# Patient Record
Sex: Male | Born: 2000 | Race: Black or African American | Marital: Single | Smoking: Never smoker
Health system: Southern US, Community
[De-identification: ages and names within clinical notes are randomized; demographics above are authoritative.]

---
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
## 2021-05-30 IMAGING — CR DG KNEE COMPLETE 4+V*R*
1 series · 4 of 4 positions shown · non-contrast
Comparison: None.

CLINICAL DATA: Knee pain

EXAM:
RIGHT KNEE - COMPLETE 4+ VIEW

[Series 1: dg knee complete 4 views right · 0.14mm/px · 4 of 4 slices shown]
[im 1/4]
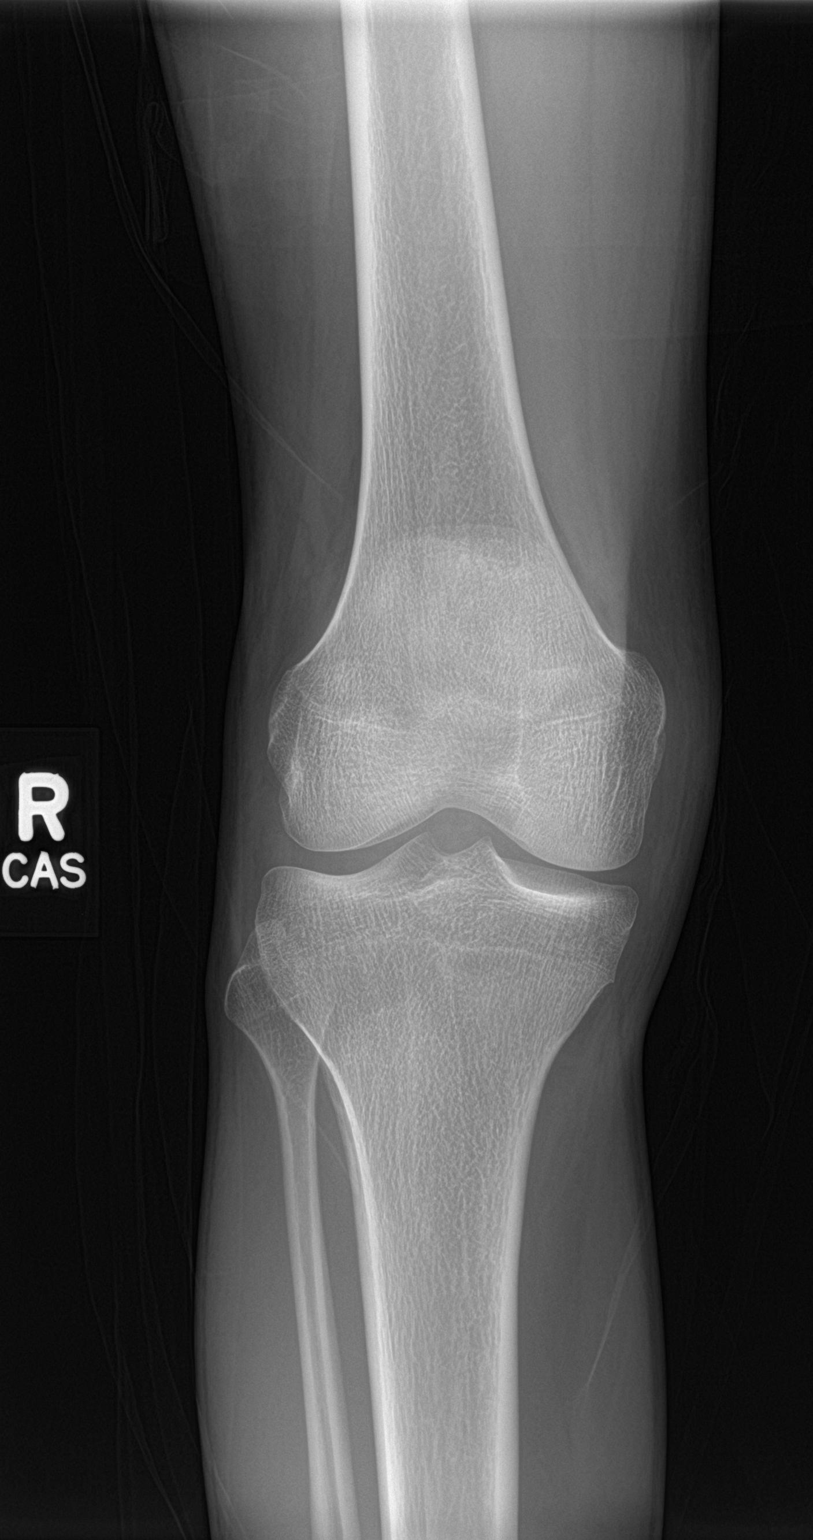
[im 2/4]
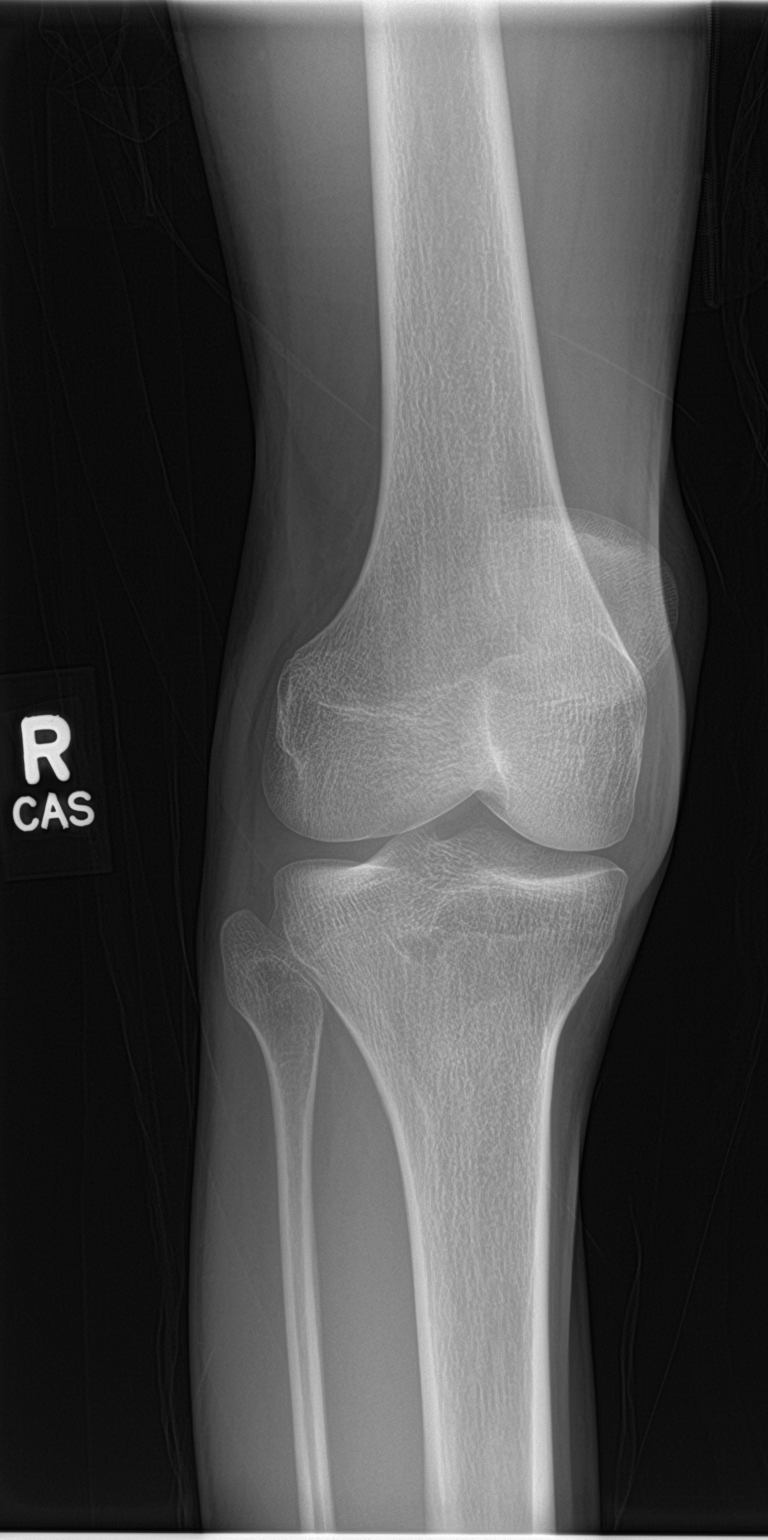
[im 3/4]
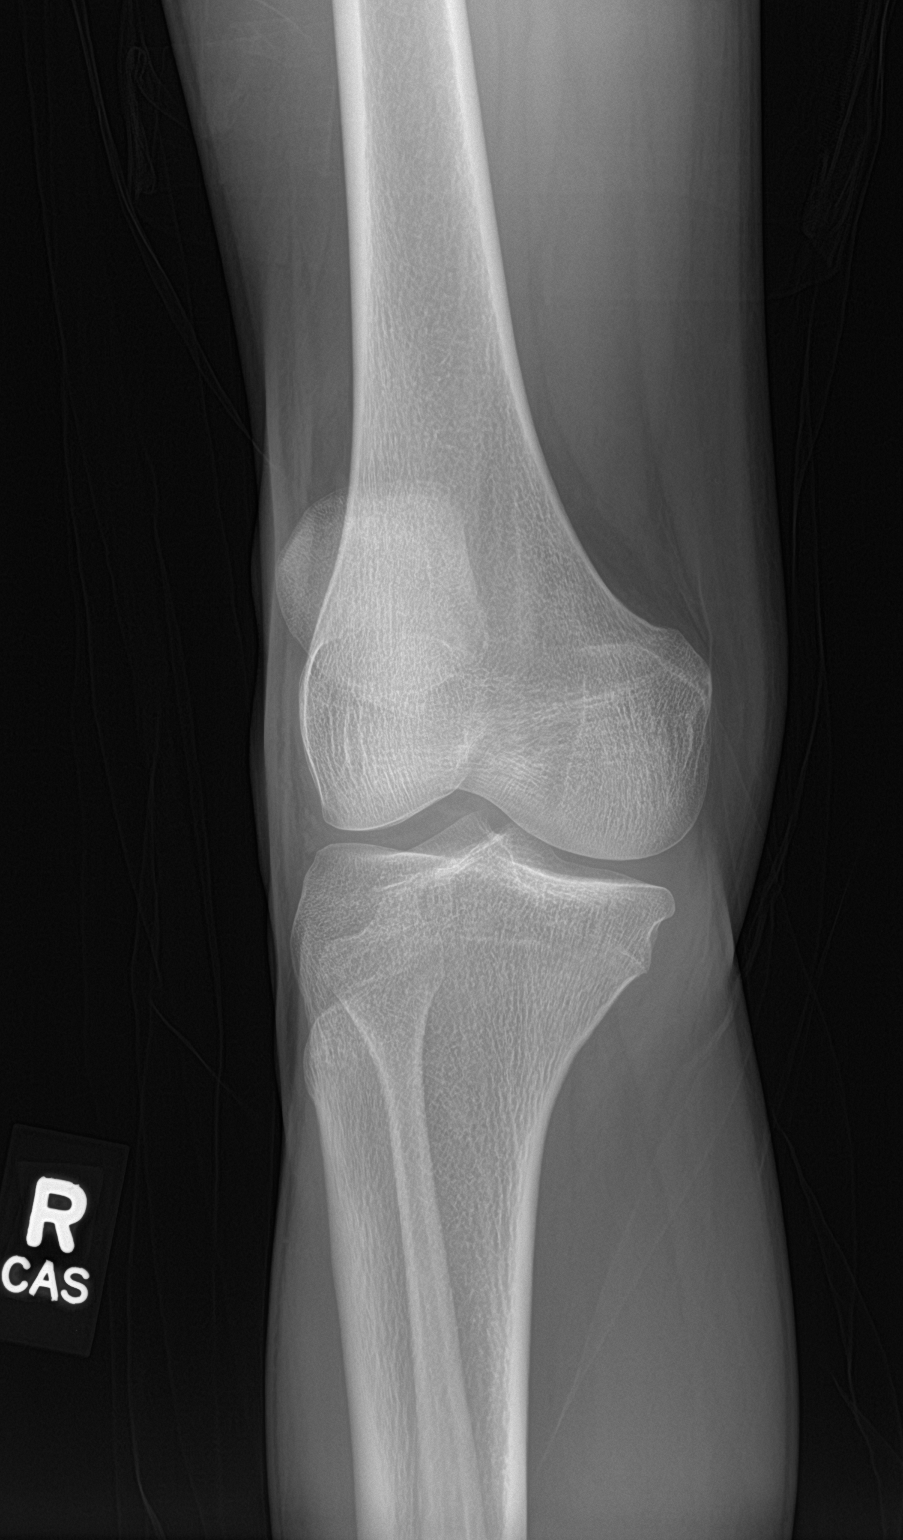
[im 4/4]
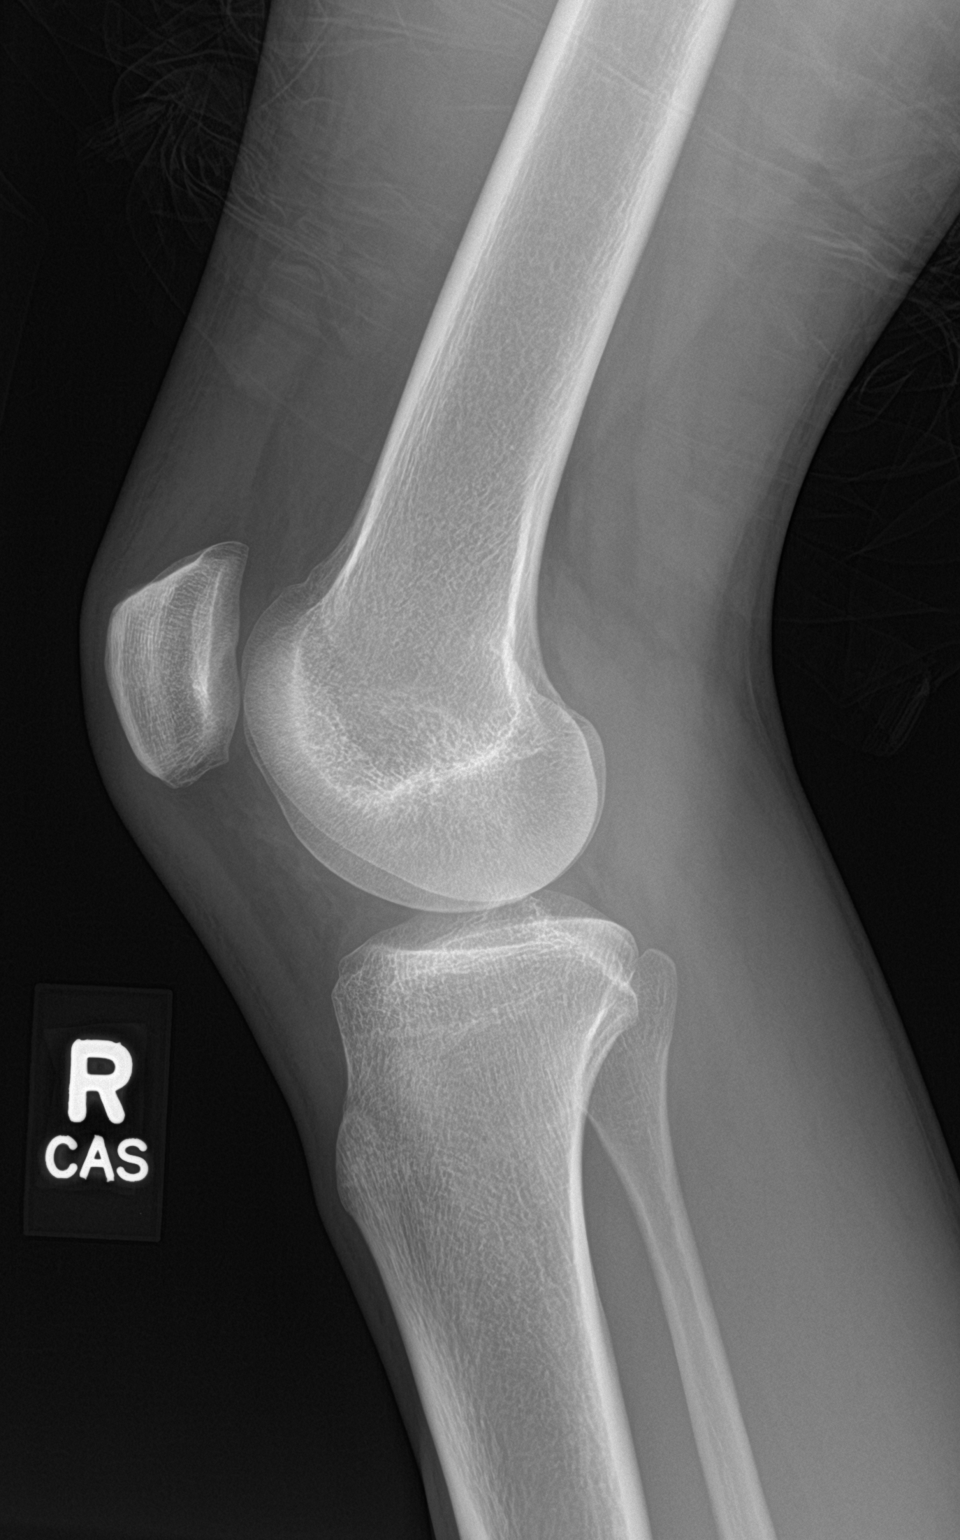

[4 of 4 positions shown; findings below may reference images not displayed]

FINDINGS: No evidence of fracture, dislocation, or joint effusion. No evidence
of arthropathy or other focal bone abnormality. Soft tissues are
unremarkable.
IMPRESSION: Negative.

## 2024-02-22 ENCOUNTER — Encounter: Payer: Self-pay | Admitting: Intensive Care

## 2024-02-22 ENCOUNTER — Other Ambulatory Visit: Payer: Self-pay

## 2024-02-22 ENCOUNTER — Emergency Department
Admission: EM | Admit: 2024-02-22 | Discharge: 2024-02-22 | Disposition: A | Discharge status: Home / Self Care | Payer: Self-pay | Attending: Emergency Medicine | Admitting: Emergency Medicine

## 2024-02-22 ENCOUNTER — Emergency Department: Payer: Self-pay

## 2024-02-22 DIAGNOSIS — M25562 Pain in left knee: Secondary | ICD-10-CM

## 2024-02-22 NOTE — ED Provider Notes (Signed)
 [facility] Provider Note    Event Date/Time   First MD Initiated Contact with Patient 02/22/24 1919     (approximate)   History   Leg Pain   HPI  Roberto Munoz is a 23 y.o. male with no documented PMH who presents for evaluation of left knee pain.  Patient states that this has been bothering him for a few days and the pain was the worst yesterday.  He is taken Tylenol and has elevated his knee which helped a little bit.  He wanted to get it checked out before returning to work and injuring himself further.      Physical Exam   Triage Vital Signs: ED Triage Vitals [02/22/24 1849]  Encounter Vitals Group     BP 129/72     Systolic BP Percentile      Diastolic BP Percentile      Pulse Rate 67     Resp 16     Temp 97.7 F (36.5 C)     Temp Source Oral     SpO2 100 %     Weight 140 lb (63.5 kg)     Height 6\' 2"  (1.88 m)     Head Circumference      Peak Flow      Pain Score 4     Pain Loc      Pain Education      Exclude from Growth Chart     Most recent vital signs: Vitals:   02/22/24 1849  BP: 129/72  Pulse: 67  Resp: 16  Temp: 97.7 F (36.5 C)  SpO2: 100%    General: Awake, no distress.  CV:  Good peripheral perfusion.  Resp:  Normal effort.  Abd:  No distention.  Other:  No swelling or overlying skin changes seen.  Tender to palpation along the lateral joint line.  No ligament laxity felt with varus and valgus stress, negative anterior and posterior drawer.  Negative Lachman's.  Negative McMurray's.  Dorsalis pedis pulse 2+ and regular.  Range of motion well-maintained.   ED Results / Procedures / Treatments   Labs (all labs ordered are listed, but only abnormal results are displayed) Labs Reviewed - No data to display    RADIOLOGY  Left knee x-ray obtained, interpreted the images as well as reviewed the radiologist report which was negative for any acute abnormalities.   PROCEDURES:  Critical Care performed:  No  Procedures   MEDICATIONS ORDERED IN ED: Medications - No data to display   IMPRESSION / MDM / ASSESSMENT AND PLAN / ED COURSE  I reviewed the triage vital signs and the nursing notes.                             23 year old male presents for evaluation of left knee pain.  Vital signs are stable patient NAD on exam.  Differential diagnosis includes, but is not limited to, fracture, dislocation, ligament injury, tendon injury, meniscus injury.  Patient's presentation is most consistent with acute complicated illness / injury requiring diagnostic workup.  Left knee x-ray is negative.  Recommended that patient take Tylenol and ibuprofen as needed for pain.  Believe patient has a tendinopathy.  We discussed icing and elevating.  He was given a note for work.  He voiced understanding, all questions were answered and he was stable at discharge.      FINAL CLINICAL IMPRESSION(S) / ED DIAGNOSES   Final diagnoses:  Acute pain of left knee     Rx / DC Orders   ED Discharge Orders     None        Note:  This document was prepared using Dragon voice recognition software and may include unintentional dictation errors.   Phyliss Breen, PA-C 02/22/24 2139    Shane Darling, MD 02/22/24 2204

## 2024-02-22 NOTE — ED Notes (Signed)
 Reviewed D/C information with the patient, pt verbalized understanding. No additional concerns at this time.

## 2024-02-22 NOTE — Discharge Instructions (Signed)
 You can take 650 mg of Tylenol and 600 mg of ibuprofen every 6 hours as needed for pain. You can use ice, heat, muscle creams and other topical pain relievers as well.

## 2024-02-22 NOTE — ED Notes (Signed)
 XR at the bedside.

## 2024-02-22 NOTE — ED Triage Notes (Signed)
 Patient c/o left knee pain. Reports no injury but has always had issues with the left leg and Sunday the knee pain "felt like it was going to explode"
